# Patient Record
Sex: Female | Born: 1945 | Race: White | Hispanic: No | Marital: Married
Health system: Southern US, Community
[De-identification: ages and names within clinical notes are randomized; demographics above are authoritative.]

## PROBLEM LIST (undated history)

## (undated) ENCOUNTER — Emergency Department: Admission: EM | Payer: Medicare Other | Source: Home / Self Care

## (undated) DIAGNOSIS — K589 Irritable bowel syndrome without diarrhea: Secondary | ICD-10-CM

## (undated) DIAGNOSIS — G8929 Other chronic pain: Secondary | ICD-10-CM

## (undated) DIAGNOSIS — I10 Essential (primary) hypertension: Secondary | ICD-10-CM

---

## 2021-06-11 ENCOUNTER — Other Ambulatory Visit: Payer: Self-pay

## 2021-06-11 ENCOUNTER — Encounter: Payer: Self-pay | Admitting: Emergency Medicine

## 2021-06-11 ENCOUNTER — Emergency Department: Payer: Medicare Other

## 2021-06-11 ENCOUNTER — Emergency Department
Admission: EM | Admit: 2021-06-11 | Discharge: 2021-06-12 | Disposition: A | Discharge status: Home / Self Care | Payer: Medicare Other | Attending: Emergency Medicine | Admitting: Emergency Medicine

## 2021-06-11 DIAGNOSIS — S299XXA Unspecified injury of thorax, initial encounter: Secondary | ICD-10-CM | POA: Diagnosis present

## 2021-06-11 DIAGNOSIS — Z20822 Contact with and (suspected) exposure to covid-19: Secondary | ICD-10-CM | POA: Insufficient documentation

## 2021-06-11 DIAGNOSIS — M549 Dorsalgia, unspecified: Secondary | ICD-10-CM

## 2021-06-11 DIAGNOSIS — X58XXXA Exposure to other specified factors, initial encounter: Secondary | ICD-10-CM | POA: Diagnosis not present

## 2021-06-11 DIAGNOSIS — S32038A Other fracture of third lumbar vertebra, initial encounter for closed fracture: Secondary | ICD-10-CM | POA: Insufficient documentation

## 2021-06-11 DIAGNOSIS — I10 Essential (primary) hypertension: Secondary | ICD-10-CM | POA: Insufficient documentation

## 2021-06-11 DIAGNOSIS — S22088A Other fracture of T11-T12 vertebra, initial encounter for closed fracture: Secondary | ICD-10-CM | POA: Insufficient documentation

## 2021-06-11 DIAGNOSIS — S22000A Wedge compression fracture of unspecified thoracic vertebra, initial encounter for closed fracture: Secondary | ICD-10-CM

## 2021-06-11 HISTORY — DX: Other chronic pain: G89.29

## 2021-06-11 HISTORY — DX: Essential (primary) hypertension: I10

## 2021-06-11 HISTORY — DX: Irritable bowel syndrome, unspecified: K58.9

## 2021-06-11 LAB — RESP PANEL BY RT-PCR (FLU A&B, COVID) ARPGX2
Influenza A by PCR: NEGATIVE
Influenza B by PCR: NEGATIVE
SARS Coronavirus 2 by RT PCR: NEGATIVE

## 2021-06-11 LAB — CBC WITH DIFFERENTIAL/PLATELET
Abs Immature Granulocytes: 0.03 10*3/uL (ref 0.00–0.07)
Basophils Absolute: 0 10*3/uL (ref 0.0–0.1)
Basophils Relative: 0 %
Eosinophils Absolute: 0 10*3/uL (ref 0.0–0.5)
Eosinophils Relative: 0 %
HCT: 35.4 % — ABNORMAL LOW (ref 36.0–46.0)
Hemoglobin: 11.3 g/dL — ABNORMAL LOW (ref 12.0–15.0)
Immature Granulocytes: 1 %
Lymphocytes Relative: 30 %
Lymphs Abs: 1.3 10*3/uL (ref 0.7–4.0)
MCH: 29.2 pg (ref 26.0–34.0)
MCHC: 31.9 g/dL (ref 30.0–36.0)
MCV: 91.5 fL (ref 80.0–100.0)
Monocytes Absolute: 0.4 10*3/uL (ref 0.1–1.0)
Monocytes Relative: 10 %
Neutro Abs: 2.7 10*3/uL (ref 1.7–7.7)
Neutrophils Relative %: 59 %
Platelets: 199 10*3/uL (ref 150–400)
RBC: 3.87 MIL/uL (ref 3.87–5.11)
RDW: 13.3 % (ref 11.5–15.5)
WBC: 4.4 10*3/uL (ref 4.0–10.5)
nRBC: 0 % (ref 0.0–0.2)

## 2021-06-11 LAB — BASIC METABOLIC PANEL
Anion gap: 9 (ref 5–15)
BUN: 25 mg/dL — ABNORMAL HIGH (ref 8–23)
CO2: 22 mmol/L (ref 22–32)
Calcium: 9.6 mg/dL (ref 8.9–10.3)
Chloride: 103 mmol/L (ref 98–111)
Creatinine, Ser: 0.98 mg/dL (ref 0.44–1.00)
GFR, Estimated: 60 mL/min — ABNORMAL LOW (ref >60)
Glucose, Bld: 97 mg/dL (ref 70–99)
Potassium: 3.4 mmol/L — ABNORMAL LOW (ref 3.5–5.1)
Sodium: 134 mmol/L — ABNORMAL LOW (ref 135–145)

## 2021-06-11 NOTE — ED Notes (Signed)
DSS called, states there is an open case on the pt and his wife, due to inability to care for themselves, contact info : Lamona Curl [phone], Doreen Beam [phone]

## 2021-06-11 NOTE — ED Provider Notes (Signed)
[facility] Provider Note    Event Date/Time   First MD Initiated Contact with Patient 06/11/21 2031     (approximate)   History   Back Pain   HPI Robin Calhoun is a 76 y.o. female  who, per outpatient clinic note dated 03/24/2020 has history of HTN, anxiety and hld, presents to the emergency department today because of concern for back pain. She states she has had back pain for a long time but it has been worse over the past few weeks. She attributes her pain to caring for her husband. She denies any specific trauma. Denies any weakness in her legs or difficulty with ambulation. Denies any difficulty with urination or defecation.     Physical Exam   Triage Vital Signs: ED Triage Vitals  Enc Vitals Group     BP 06/11/21 1754 (!) 156/62     Pulse Rate 06/11/21 1754 90     Resp 06/11/21 1754 19     Temp 06/11/21 1754 97.6 F (36.4 C)     Temp Source 06/11/21 1754 Oral     SpO2 06/11/21 1754 99 %     Weight 06/11/21 1755 150 lb (68 kg)     Height 06/11/21 1755 5\' 2"  (1.575 m)     Head Circumference --      Peak Flow --      Pain Score 06/11/21 1754 6     Pain Loc --      Pain Edu? --      Excl. in GC? --     Most recent vital signs: Vitals:   06/11/21 1754 06/11/21 2030  BP: (!) 156/62 (!) 156/64  Pulse: 90 85  Resp: 19   Temp: 97.6 F (36.4 C)   SpO2: 99% 99%    General: Awake, no distress.  CV:  Good peripheral perfusion.  Resp:  Normal effort.  Abd:  No distention.  MSK:  Some tenderness to thoracic spine.    ED Results / Procedures / Treatments   Labs (all labs ordered are listed, but only abnormal results are displayed) Labs Reviewed - No data to display   EKG  None   RADIOLOGY Lumbar spine x-ray I independently interpreted and visualized the lumbar spine x-ray and loss of vertebral height in one vertebrae. Radiology interpretation: IMPRESSION:  Moderate L3 and mild T12 vertebral body height loss, age   indeterminate. No comparison studies are available to assess  chronicity. Recommend clinical correlation for point tenderness.        PROCEDURES:  Critical Care performed: No  Procedures   MEDICATIONS ORDERED IN ED: Medications - No data to display   IMPRESSION / MDM / ASSESSMENT AND PLAN / ED COURSE  I reviewed the triage vital signs and the nursing notes.                              Differential diagnosis includes, but is not limited to, musculoskeletal pain, UTI.   Patient presents to the emergency department today because of concerns for back pain.  She attributes it to caring for her husband.  It has been going on for a while but worse over the past couple of days.  X-ray does show compression fractures of T12 and L3.  She did not have any specific tenderness over her lumbar spine but did have some tenderness of her thoracic.  She states that she was prescribed narcotic pain medication  for this back pain.  This time I do not feel patient requires admission given that her pain is fairly well controlled.  However the patient does not feel she is safe to go home.  Apparently DSS has been involved in her case.  Will have social work evaluate for possible placement.  FINAL CLINICAL IMPRESSION(S) / ED DIAGNOSES   Final diagnoses:  Back pain, unspecified back location, unspecified back pain laterality, unspecified chronicity  Compression fracture of body of thoracic vertebra [facility])     Note:  This document was prepared using Dragon voice recognition software and may include unintentional dictation errors.    Phineas Semen, MD 06/11/21 [phone]

## 2021-06-11 NOTE — ED Provider Triage Note (Signed)
Emergency Medicine Provider Triage Evaluation Note  Robin Calhoun, a 76 y.o. female  was evaluated in triage.  Pt complains of acute on chronic low back pain.  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  She reports pain secondary to having to care for him.  Patient presents after multiple EMS calls out today, and finally encouraged to be transported after DSS made a welfare check.  Patient denies any bladder or bowel incontinence, dysuria, foot drop, saddle anesthesia.  Patient reports that she takes Tylenol and Motrin daily for the pain, and will occasionally take oxycodone for more severe pain.  Review of Systems  Positive: LBP Negative: NVD  Physical Exam  BP (!) 156/62 (BP Location: Right Arm)    Pulse 90    Temp 97.6 F (36.4 C) (Oral)    Resp 19    Ht 5\' 2"  (1.575 m)    Wt 68 kg    SpO2 99%    BMI 27.44 kg/m  Gen:   Awake, no distress   Resp:  Normal effort  MSK:   Moves extremities without difficulty  Other:  CVS: RRR  Medical Decision Making  Medically screening exam initiated at 5:58 PM.  Appropriate orders placed.  Robin Calhoun was informed that the remainder of the evaluation will be completed by another provider, this initial triage assessment does not replace that evaluation, and the importance of remaining in the ED until their evaluation is complete.  Geriatric patient with ED evaluation of acute flare of chronic low back pain.  Patient denies any preceding injury, trauma, or fall.  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   Alwyn Ren, PA-C 06/11/21 1800

## 2021-06-11 NOTE — ED Triage Notes (Signed)
Pt in via [facility] from home with her husband. Pt reports lower back pain for months due to taking care of her husband. Pt reports he has difficulty getting around and she has to help him.

## 2021-06-11 NOTE — Discharge Instructions (Addendum)
Please follow up with neurosurgery for your back pain.

## 2021-06-11 NOTE — ED Notes (Signed)
Patient reports back pain for "quite a while" as she states she has been taking care of her husband, see note regarding open DSS case. Pt also complains of dizziness that started this morning, denies currently.

## 2021-06-12 MED ORDER — HYDROCODONE-ACETAMINOPHEN 5-325 MG PO TABS: 1.0000 | ORAL_TABLET | Freq: Four times a day (QID) | ORAL | 0 refills | Status: AC | PRN

## 2021-06-12 MED ORDER — SIMVASTATIN 10 MG PO TABS: 20.0000 mg | ORAL_TABLET | Freq: Every day | ORAL | Status: DC

## 2021-06-12 MED ORDER — VENLAFAXINE HCL ER 150 MG PO CP24
150.0000 mg | ORAL_CAPSULE | Freq: Two times a day (BID) | ORAL | Status: DC
  Administered 2021-06-12: 150 mg via ORAL
  Filled 2021-06-12 (×2): qty 1

## 2021-06-12 MED ORDER — SPIRONOLACTONE 25 MG PO TABS
50.0000 mg | ORAL_TABLET | Freq: Every day | ORAL | Status: DC
  Administered 2021-06-12: 50 mg via ORAL
  Filled 2021-06-12: qty 2

## 2021-06-12 MED ORDER — AMLODIPINE BESYLATE 5 MG PO TABS
10.0000 mg | ORAL_TABLET | Freq: Every day | ORAL | Status: DC
  Administered 2021-06-12: 10 mg via ORAL
  Filled 2021-06-12: qty 2

## 2021-06-12 MED ORDER — ACETAMINOPHEN 500 MG PO TABS
1000.0000 mg | ORAL_TABLET | Freq: Four times a day (QID) | ORAL | Status: DC | PRN
  Administered 2021-06-12: 1000 mg via ORAL
  Filled 2021-06-12: qty 2

## 2021-06-12 MED ORDER — POLYVINYL ALCOHOL 1.4 % OP SOLN
1.0000 [drp] | OPHTHALMIC | Status: DC | PRN
  Filled 2021-06-12: qty 15

## 2021-06-12 MED ORDER — DICYCLOMINE HCL 20 MG PO TABS
20.0000 mg | ORAL_TABLET | Freq: Three times a day (TID) | ORAL | Status: DC | PRN
  Filled 2021-06-12: qty 1

## 2021-06-12 MED ORDER — ATENOLOL 25 MG PO TABS
100.0000 mg | ORAL_TABLET | Freq: Every day | ORAL | Status: DC
  Administered 2021-06-12: 100 mg via ORAL
  Filled 2021-06-12: qty 4

## 2021-06-12 NOTE — ED Notes (Signed)
RN attempted to review home medications, patient able to tell RN what medications she takes, to the best of her ability, unable to verify dosages of medications, and does not have a list with her. Ward, MD notified.

## 2021-06-12 NOTE — ED Provider Notes (Signed)
I think at this patient approximately 0 700.  In brief patient signed out to me as a border initially presenting with some subacute to chronic low back pain and found to have subacute to chronic T12 compression fracture.  No focal neurological deficits or other associated acute injuries.  On my assessment patient states she has no new symptoms and has been ongoing for weeks.  She has no difficulty ambulating and p.o. going provider no new neurological deficits.  I discussed with on-call neurosurgeon Dr. Cari Caraway who says no specific need for bracing unless patient is requesting this.  Patient states he prefers to hold off and will follow up with [facility] in clinic.  She states she feels comfortable going home and does not feel unsafe at home.  Given she is ambulating without difficulty increasing symptoms with patient requesting go home I think this is reasonable.  Will defer PT OT consult for placement and have patient follow-up outpatient.  Discharged in stable condition.  Strict return precautions advised and discussed.  Rx written for analgesia.   Lucrezia Starch, MD 06/12/21 [phone]

## 2021-06-12 NOTE — ED Provider Notes (Signed)
Today's Vitals   06/12/21 0200 06/12/21 0307 06/12/21 0400 06/12/21 0401  BP:   (!) 164/64   Pulse:   92   Resp:   16   Temp:   98 F (36.7 C)   TempSrc:   Oral   SpO2:   96%   Weight:      Height:      PainSc: 0-No pain 0-No pain  5    Body mass index is 27.44 kg/m.  Patient resting comfortably.  No acute events overnight.  Awaiting social work disposition.  Will need home medications ordered once verified by pharmacy.   Trennon Torbeck, Layla Maw, DO 06/12/21 [phone]

## 2021-06-12 NOTE — ED Notes (Signed)
Per pt, she just talked to her daughter and daughter is trying to arrange a ride for her. Pt asked about her pants/shirt, not in room. Other belongings such as purse and jacket in room with patient.

## 2021-06-12 NOTE — ED Notes (Signed)
Pt repositioned in the bed by self. RN assisted with moving bed lower/higher.

## 2021-06-12 NOTE — ED Notes (Signed)
Attempted to call pt's daughter Lurena Joiner, no answer. Per charge nurse Marylene Land, pt can wait in the waiting room for ride.

## 2021-06-12 NOTE — ED Notes (Signed)
Bfast tray provided, pt ao x 4, NAD, denies complaints.

## 2021-06-12 NOTE — ED Notes (Signed)
Patient provided with warm blankets per request, denies other needs at this time, resting quietly in bed.

## 2021-06-12 NOTE — ED Notes (Signed)
Pt requesting to be wheeled to her husband's room who is admitted here. Pt ao x 4.

## 2022-11-30 ENCOUNTER — Encounter: Payer: Self-pay | Admitting: Emergency Medicine

## 2022-11-30 ENCOUNTER — Emergency Department
Admission: EM | Admit: 2022-11-30 | Discharge: 2022-11-30 | Disposition: A | Discharge status: Home / Self Care | Payer: Medicare HMO | Attending: Emergency Medicine | Admitting: Emergency Medicine

## 2022-11-30 ENCOUNTER — Other Ambulatory Visit: Payer: Self-pay

## 2022-11-30 DIAGNOSIS — R42 Dizziness and giddiness: Secondary | ICD-10-CM | POA: Diagnosis present

## 2022-11-30 DIAGNOSIS — E860 Dehydration: Secondary | ICD-10-CM | POA: Diagnosis not present

## 2022-11-30 DIAGNOSIS — R55 Syncope and collapse: Secondary | ICD-10-CM | POA: Diagnosis not present

## 2022-11-30 DIAGNOSIS — E876 Hypokalemia: Secondary | ICD-10-CM | POA: Insufficient documentation

## 2022-11-30 LAB — CBC WITH DIFFERENTIAL/PLATELET
Abs Immature Granulocytes: 0.03 10*3/uL (ref 0.00–0.07)
Basophils Absolute: 0 10*3/uL (ref 0.0–0.1)
Basophils Relative: 0 %
Eosinophils Absolute: 0.1 10*3/uL (ref 0.0–0.5)
Eosinophils Relative: 1 %
HCT: 40.5 % (ref 36.0–46.0)
Hemoglobin: 13.2 g/dL (ref 12.0–15.0)
Immature Granulocytes: 0 %
Lymphocytes Relative: 22 %
Lymphs Abs: 1.6 10*3/uL (ref 0.7–4.0)
MCH: 31.3 pg (ref 26.0–34.0)
MCHC: 32.6 g/dL (ref 30.0–36.0)
MCV: 96 fL (ref 80.0–100.0)
Monocytes Absolute: 0.4 10*3/uL (ref 0.1–1.0)
Monocytes Relative: 5 %
Neutro Abs: 5.2 10*3/uL (ref 1.7–7.7)
Neutrophils Relative %: 72 %
Platelets: 220 10*3/uL (ref 150–400)
RBC: 4.22 MIL/uL (ref 3.87–5.11)
RDW: 12.7 % (ref 11.5–15.5)
WBC: 7.3 10*3/uL (ref 4.0–10.5)
nRBC: 0 % (ref 0.0–0.2)

## 2022-11-30 LAB — COMPREHENSIVE METABOLIC PANEL
ALT: 15 U/L (ref 0–44)
AST: 19 U/L (ref 15–41)
Albumin: 3.8 g/dL (ref 3.5–5.0)
Alkaline Phosphatase: 56 U/L (ref 38–126)
Anion gap: 9 (ref 5–15)
BUN: 17 mg/dL (ref 8–23)
CO2: 22 mmol/L (ref 22–32)
Calcium: 9.2 mg/dL (ref 8.9–10.3)
Chloride: 106 mmol/L (ref 98–111)
Creatinine, Ser: 0.92 mg/dL (ref 0.44–1.00)
GFR, Estimated: >60 mL/min (ref >60)
Glucose, Bld: 113 mg/dL — ABNORMAL HIGH (ref 70–99)
Potassium: 3.2 mmol/L — ABNORMAL LOW (ref 3.5–5.1)
Sodium: 137 mmol/L (ref 135–145)
Total Bilirubin: 0.8 mg/dL (ref 0.3–1.2)
Total Protein: 6.8 g/dL (ref 6.5–8.1)

## 2022-11-30 LAB — MAGNESIUM: Magnesium: 1.9 mg/dL (ref 1.7–2.4)

## 2022-11-30 MED ORDER — POTASSIUM CHLORIDE CRYS ER 20 MEQ PO TBCR
40.0000 meq | EXTENDED_RELEASE_TABLET | Freq: Once | ORAL | Status: AC
  Administered 2022-11-30: 40 meq via ORAL
  Filled 2022-11-30: qty 2

## 2022-11-30 MED ORDER — SODIUM CHLORIDE 0.9 % IV BOLUS
500.0000 mL | Freq: Once | INTRAVENOUS | Status: AC
  Administered 2022-11-30: 500 mL via INTRAVENOUS

## 2022-11-30 NOTE — ED Provider Notes (Signed)
[facility] Provider Note    Event Date/Time   First MD Initiated Contact with Patient 11/30/22 1326     (approximate)   History   Near Syncope   HPI Robin Calhoun is a 77 y.o. female with multiple myeloma who presents today for near syncope.  Patient states she was taking her medications earlier today when shortly after she felt warm and lightheaded.  She did not fully pass out.  Around this event, she denies chest pain, palpitations, shortness of breath, nausea, abdominal pain, diaphoresis.  She had recently restarted taking her multiple myeloma medications in the past week after she had stopped them while on Paxlovid for COVID.  In the emergency department here she is currently denying all symptoms other than feeling slightly tired.     Physical Exam   Triage Vital Signs: ED Triage Vitals  Encounter Vitals Group     BP 11/30/22 1311 (!) 173/86     Systolic BP Percentile --      Diastolic BP Percentile --      Pulse Rate 11/30/22 1311 80     Resp 11/30/22 1311 16     Temp 11/30/22 1311 98 F (36.7 C)     Temp Source 11/30/22 1311 Oral     SpO2 11/30/22 1308 97 %     Weight 11/30/22 1309 148 lb (67.1 kg)     Height 11/30/22 1309 5' (1.524 m)     Head Circumference --      Peak Flow --      Pain Score 11/30/22 1309 0     Pain Loc --      Pain Education --      Exclude from Growth Chart --     Most recent vital signs: Vitals:   11/30/22 1311 11/30/22 1400  BP: (!) 173/86 (!) 154/87  Pulse: 80 85  Resp: 16 (!) 25  Temp: 98 F (36.7 C)   SpO2: 97% 96%    Physical Exam: I have reviewed the vital signs and nursing notes. General: Awake, alert, no acute distress.  Nontoxic appearing. Head:  Atraumatic, normocephalic.   ENT:  EOM intact, PERRL. Oral mucosa is pink and moist with no lesions. Neck: Neck is supple with full range of motion, No meningeal signs. Cardiovascular:  RRR, No murmurs. Peripheral pulses palpable and equal  bilaterally. Respiratory:  Symmetrical chest wall expansion.  No rhonchi, rales, or wheezes.  Good air movement throughout.  No use of accessory muscles.   Musculoskeletal:  No cyanosis or edema. Moving extremities with full ROM Abdomen:  Soft, nontender, nondistended. Neuro:  GCS 15, moving all four extremities, interacting appropriately. Speech clear. Psych:  Calm, appropriate.   Skin:  Warm, dry, no rash.    ED Results / Procedures / Treatments   Labs (all labs ordered are listed, but only abnormal results are displayed) Labs Reviewed  COMPREHENSIVE METABOLIC PANEL - Abnormal; Notable for the following components:      Result Value   Potassium 3.2 (*)    Glucose, Bld 113 (*)    All other components within normal limits  CBC WITH DIFFERENTIAL/PLATELET  MAGNESIUM     EKG My EKG interpretation at 1311: Rate of 79, normal sinus rhythm, normal axis, normal intervals.  Right bundle branch block present.  No acute ST elevations or depressions   RADIOLOGY    PROCEDURES:  Critical Care performed: No  Procedures   MEDICATIONS ORDERED IN ED: Medications  sodium chloride 0.9 %  bolus 500 mL (500 mLs Intravenous New Bag/Given 11/30/22 1349)  potassium chloride SA (KLOR-CON M) CR tablet 40 mEq (40 mEq Oral Given 11/30/22 1417)     IMPRESSION / MDM / ASSESSMENT AND PLAN / ED COURSE  I reviewed the triage vital signs and the nursing notes.                              Differential diagnosis includes, but is not limited to, vasovagal syncope, hyperviscosity syndrome from her multiple myeloma, dehydration.  Patient's presentation is most consistent with acute, uncomplicated illness.  Patient presented today with near syncopal episode including warmth and lightheadedness briefly after taking her daily medications.  On arrival, patient's vital signs were stable and physical exam unremarkable.  She was rather asymptomatic at that time.  Laboratory workup largely reassuring aside from  mild hypokalemia.  No concern for hyper viscosity syndrome from her multiple myeloma.  This may be potentially medication related as she restarted a bunch of her multiple myeloma medications within the past week after not being able to be on them while on Paxlovid for COVID.  Otherwise, patient was ambulatory without issue while here in the emergency department.  Stable for discharge at this time and will follow-up with her oncologist tomorrow.  She was given strict return precautions.    Clinical Course as of 11/30/22 1456  Wed Nov 30, 2022  1356 CBC with Differential Unremarkable. No concern for hyperviscosity syndrome as source of near-syncope [DW]  1403 Comprehensive metabolic panel(!) Mild hypokalemia, otherwise reassuring [DW]  1403 Magnesium: 1.9 [DW]  1446 Patient ambulated and underwent orthostatics with no symptoms.  Patient safe for discharge at this time. [DW]    Clinical Course User Index [DW] Janith Lima, MD     FINAL CLINICAL IMPRESSION(S) / ED DIAGNOSES   Final diagnoses:  Near syncope  Dehydration  Hypokalemia     Rx / DC Orders   ED Discharge Orders     None        Note:  This document was prepared using Dragon voice recognition software and may include unintentional dictation errors.   Janith Lima, MD 11/30/22 [phone]

## 2022-11-30 NOTE — Discharge Instructions (Signed)
You were seen in the emergency department today for your near syncopal episode.  Your laboratory workup was reassuring at this time.  Please call your oncologist tomorrow to discuss the symptoms occurring around when you take

## 2022-11-30 NOTE — ED Notes (Signed)
Patient ambulating in room independently. States she does slightly feel lightheaded. NAD noted.

## 2022-11-30 NOTE — ED Notes (Signed)
Patient dc'd to lobby in wheelchair. Report given to [facility] of - they are send transport to pick up patient. First Nurse, Morrie Sheldon aware of patient in lobby awaiting ride.

## 2022-11-30 NOTE — ED Triage Notes (Signed)
Patient to ED via [facility] from [facility] after near syncope. Patient states she was sitting at lunch and got nausea and lightheaded. States she started back taking her oral chemo 2 days ago for multiple myeloma. Given 4mg  of zofran by EMS.

## 2023-03-16 IMAGING — CR DG LUMBAR SPINE COMPLETE 4+V
1 series · 5 of 5 positions shown · non-contrast
Comparison: None.

CLINICAL DATA: Acute exacerbation of chronic lower back pain.

EXAM:
LUMBAR SPINE - COMPLETE 4+ VIEW

[Series 1: dg lumbar spine complete 4 +v · 0.14mm/px · 5 of 5 slices shown]
[im 1/5]
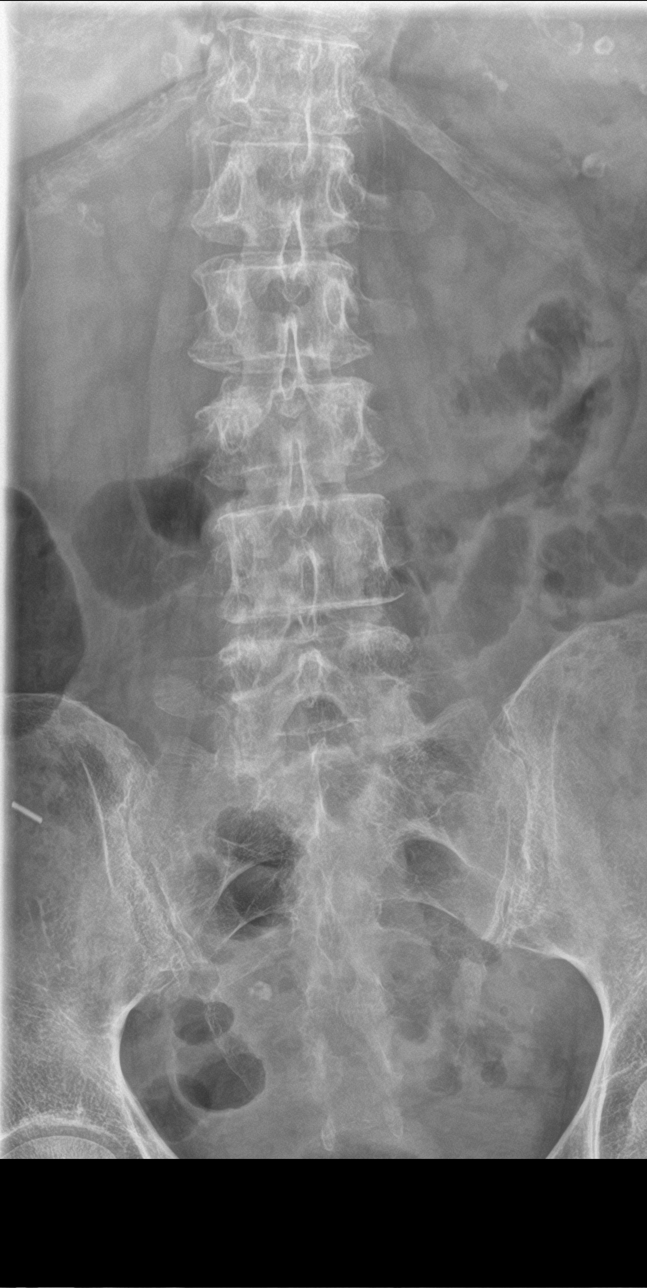
[im 2/5]
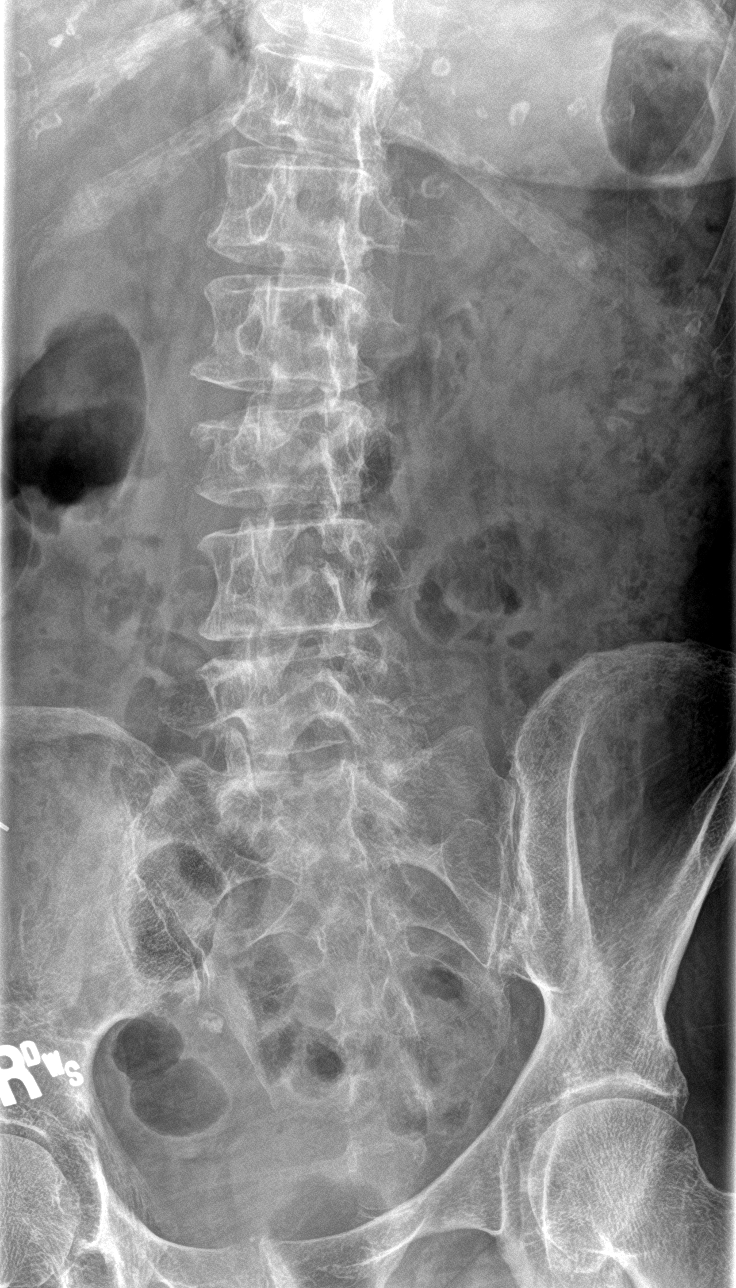
[im 3/5]
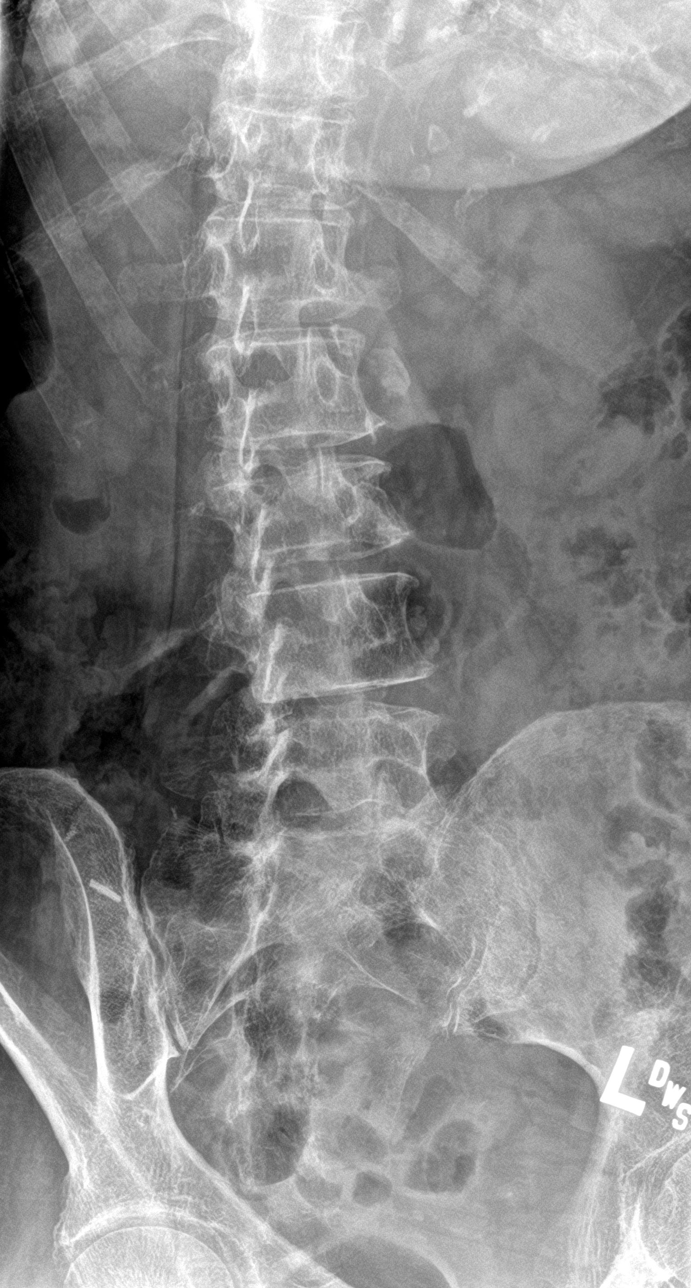
[im 4/5]
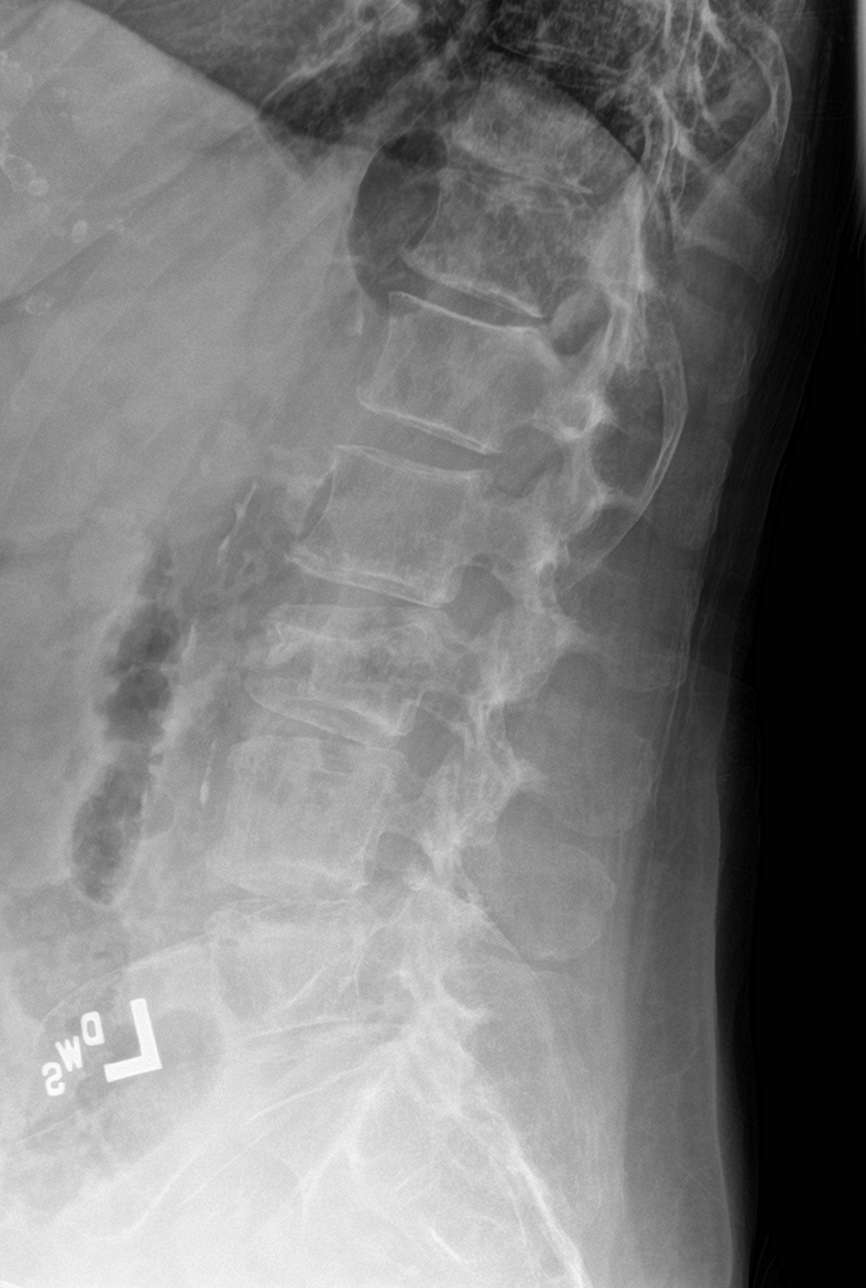
[im 5/5]
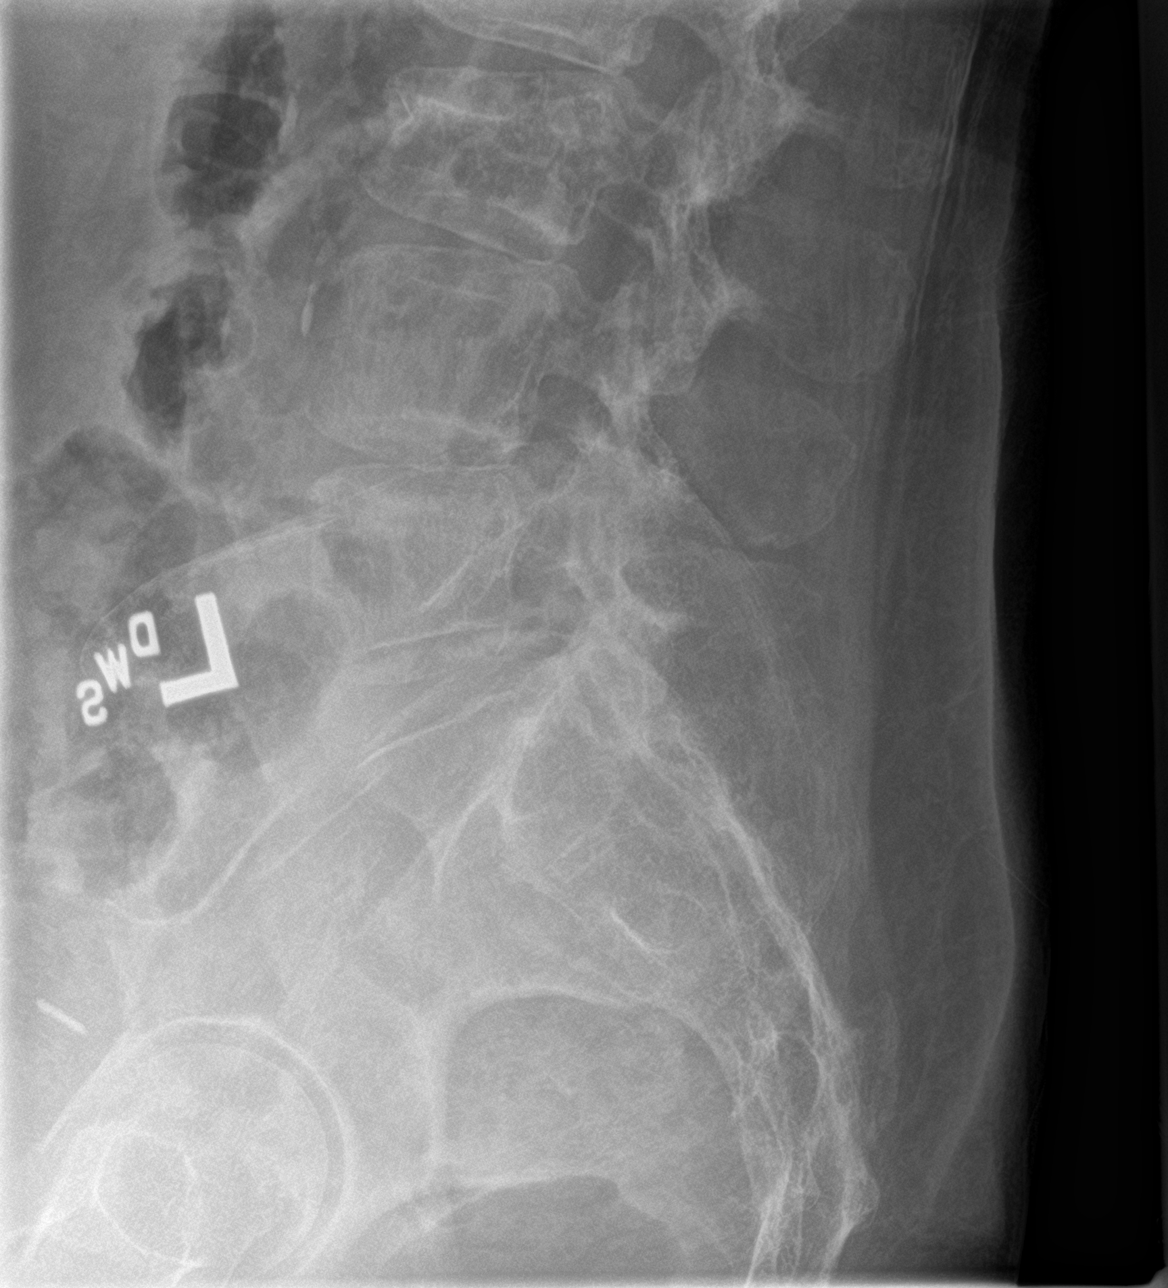

[5 of 5 positions shown; findings below may reference images not displayed]

FINDINGS: There are 5 non-rib-bearing lumbar-type vertebral bodies. Minimal
dextrocurvature centered at L1-2 with minimal left L1-2 and L2-3
disc space narrowing.

No sagittal spondylolisthesis.

Minimal anterior superior T12 vertebral body height loss and
moderate anterior L3 vertebral body height loss, age indeterminate.

Mild-to-moderate T11-12 and mild L2-3 through L4-5 disc space
narrowing.
IMPRESSION: Moderate L3 and mild T12 vertebral body height loss, age
indeterminate. No comparison studies are available to assess
chronicity. Recommend clinical correlation for point tenderness.

## 2024-04-15 ENCOUNTER — Other Ambulatory Visit: Payer: Self-pay

## 2024-04-15 ENCOUNTER — Emergency Department
Admission: EM | Admit: 2024-04-15 | Discharge: 2024-04-15 | Disposition: A | Discharge status: Home / Self Care | Attending: Emergency Medicine | Admitting: Emergency Medicine

## 2024-04-15 ENCOUNTER — Emergency Department

## 2024-04-15 DIAGNOSIS — S0101XA Laceration without foreign body of scalp, initial encounter: Secondary | ICD-10-CM | POA: Diagnosis not present

## 2024-04-15 DIAGNOSIS — I10 Essential (primary) hypertension: Secondary | ICD-10-CM | POA: Diagnosis not present

## 2024-04-15 DIAGNOSIS — Z23 Encounter for immunization: Secondary | ICD-10-CM | POA: Insufficient documentation

## 2024-04-15 DIAGNOSIS — W01198A Fall on same level from slipping, tripping and stumbling with subsequent striking against other object, initial encounter: Secondary | ICD-10-CM | POA: Diagnosis not present

## 2024-04-15 DIAGNOSIS — W19XXXA Unspecified fall, initial encounter: Secondary | ICD-10-CM

## 2024-04-15 DIAGNOSIS — S0990XA Unspecified injury of head, initial encounter: Secondary | ICD-10-CM | POA: Diagnosis present

## 2024-04-15 MED ORDER — TETANUS-DIPHTH-ACELL PERTUSSIS 5-2-15.5 LF-MCG/0.5 IM SUSP
0.5000 mL | Freq: Once | INTRAMUSCULAR | Status: AC
  Administered 2024-04-15: 0.5 mL via INTRAMUSCULAR
  Filled 2024-04-15: qty 0.5

## 2024-04-15 MED ORDER — LIDOCAINE-EPINEPHRINE 2 %-1:100000 IJ SOLN
20.0000 mL | Freq: Once | INTRAMUSCULAR | Status: AC
  Administered 2024-04-15: 20 mL
  Filled 2024-04-15: qty 1

## 2024-04-15 NOTE — ED Triage Notes (Signed)
 Patient fell, hit her head, has a lac on the right side of her head, no LOC

## 2024-04-15 NOTE — ED Provider Notes (Signed)
 "  [facility] Provider Note    Event Date/Time   First MD Initiated Contact with Patient 04/15/24 [phone]     (approximate)   History   Chief Complaint Fall and Laceration (Right side head lac due to fall.)   HPI  Robin Calhoun is a 78 y.o. female with past medical history of hypertension and IBS who presents to the ED complaining of fall.  Patient reports that just prior to arrival she had gotten up to go to the bathroom but did not turn on the lights.  She states that she tripped and lost her balance, falling and striking her head on a bedside table.  She did not lose consciousness but noticed a significant cut to the right side of her head with bleeding.  She complains of headache but denies any neck pain, also denies any pain to her chest, abdomen, or extremities.  She does not take a blood thinner.     Physical Exam   Triage Vital Signs: ED Triage Vitals [04/15/24 0142]  Encounter Vitals Group     BP      Girls Systolic BP Percentile      Girls Diastolic BP Percentile      Boys Systolic BP Percentile      Boys Diastolic BP Percentile      Pulse      Resp      Temp      Temp src      SpO2      Weight      Height      Head Circumference      Peak Flow      Pain Score 0     Pain Loc      Pain Education      Exclude from Growth Chart     Most recent vital signs: Vitals:   04/15/24 0144  BP: 129/71  Pulse: 84  Resp: 18  Temp: 97.8 F (36.6 C)  SpO2: 99%    Constitutional: Alert and oriented. Eyes: Conjunctivae are normal. Head: Right temporal scalp laceration with associated hematoma and small area of pulsatile bleeding. Nose: No congestion/rhinnorhea. Mouth/Throat: Mucous membranes are moist.  Neck: No midline cervical spine tenderness to palpation. Cardiovascular: Normal rate, regular rhythm. Grossly normal heart sounds.  2+ radial pulses bilaterally. Respiratory: Normal respiratory effort.  No retractions. Lungs CTAB.  No chest  wall tenderness to palpation. Gastrointestinal: Soft and nontender. No distention. Musculoskeletal: No lower extremity tenderness nor edema.  No upper extremity bony tenderness to palpation. Neurologic:  Normal speech and language. No gross focal neurologic deficits are appreciated.    ED Results / Procedures / Treatments   Labs (all labs ordered are listed, but only abnormal results are displayed) Labs Reviewed - No data to display   RADIOLOGY CT head reviewed and interpreted by me with no hemorrhage or midline shift.  PROCEDURES:  Critical Care performed: No  .Laceration Repair  Date/Time: 04/15/2024 2:06 AM  Performed by: Willo Dunnings, MD Authorized by: Willo Dunnings, MD   Consent:    Consent obtained:  Verbal   Consent given by:  Patient   Risks, benefits, and alternatives were discussed: yes   Universal protocol:    Patient identity confirmed:  Verbally with patient and arm band Anesthesia:    Anesthesia method:  Local infiltration   Local anesthetic:  Lidocaine  2% WITH epi Laceration details:    Location:  Scalp   Scalp location:  R temporal  Length (cm):  5 Exploration:    Hemostasis achieved with:  Epinephrine  and direct pressure   Wound exploration: wound explored through full range of motion and entire depth of wound visualized     Wound extent: areolar tissue not violated, fascia not violated, no foreign body, no signs of injury, no nerve damage, no tendon damage, no underlying fracture and no vascular damage     Contaminated: no   Treatment:    Area cleansed with:  Saline   Amount of cleaning:  Standard   Irrigation solution:  Sterile saline   Irrigation method:  Syringe   Debridement:  None   Undermining:  None   Scar revision: no   Skin repair:    Repair method:  Sutures   Suture size:  4-0   Suture material:  Nylon   Suture technique:  Simple interrupted   Number of sutures:  4 Approximation:    Approximation:  Close Post-procedure  details:    Dressing:  Open (no dressing)   Procedure completion:  Tolerated well, no immediate complications    MEDICATIONS ORDERED IN ED: Medications  lidocaine -EPINEPHrine  (XYLOCAINE  W/EPI) 2 %-1:100000 (with pres) injection 20 mL (20 mLs Infiltration Given 04/15/24 0203)  Tdap (ADACEL ) injection 0.5 mL (0.5 mLs Intramuscular Given 04/15/24 0239)     IMPRESSION / MDM / ASSESSMENT AND PLAN / ED COURSE  I reviewed the triage vital signs and the nursing notes.                              78 y.o. female with past medical history of hypertension and IBS who presents to the ED complaining of fall with head injury and laceration just prior to arrival.  Patient's presentation is most consistent with acute complicated illness / injury requiring diagnostic workup.  Differential diagnosis includes, but is not limited to, intracranial injury, cervical spine injury, laceration.  Patient nontoxic-appearing and in no acute distress, vital signs are unremarkable.  She does have approximately 5 cm laceration to her right temporal scalp with small area of pulsatile bleeding.  We were unable to control bleeding with pressure, patient anesthetized with lidocaine  with epinephrine  and bleeding now controlled following 4 sutures.  We will further assess with CT head and cervical spine, no evidence of traumatic injury to her trunk or extremities.  CT head and cervical spine are negative for acute traumatic injury, bony lesions noted on cervical spine but patient reports history of multiple myeloma currently in remission.  She has follow-up related to her multiple myeloma later this week, no acute intervention needed at this time.  Laceration remains hemostatic on recheck and she is appropriate for discharge home with outpatient follow-up and suture removal in 1 week.  She was counseled to return to the ED for new or worsening symptoms, patient and son agree with plan.      FINAL CLINICAL IMPRESSION(S) /  ED DIAGNOSES   Final diagnoses:  Fall, initial encounter  Laceration of scalp, initial encounter     Rx / DC Orders   ED Discharge Orders     None        Note:  This document was prepared using Dragon voice recognition software and may include unintentional dictation errors.   Willo Dunnings, MD 04/15/24 0330  "
